# Patient Record
Sex: Male | Born: 1970 | Race: White | Hispanic: No | Marital: Married
Health system: Southern US, Community
[De-identification: ages and names within clinical notes are randomized; demographics above are authoritative.]

## PROBLEM LIST (undated history)

## (undated) DIAGNOSIS — N200 Calculus of kidney: Secondary | ICD-10-CM

---

## 1997-05-12 ENCOUNTER — Emergency Department (HOSPITAL_COMMUNITY): Admission: EM | Admit: 1997-05-12 | Discharge: 1997-05-12 | Payer: Self-pay | Admitting: Emergency Medicine

## 1997-06-04 ENCOUNTER — Emergency Department (HOSPITAL_COMMUNITY): Admission: EM | Admit: 1997-06-04 | Discharge: 1997-06-04 | Payer: Self-pay | Admitting: Emergency Medicine

## 1997-06-30 ENCOUNTER — Emergency Department (HOSPITAL_COMMUNITY): Admission: EM | Admit: 1997-06-30 | Discharge: 1997-06-30 | Payer: Self-pay | Admitting: Emergency Medicine

## 1998-10-16 ENCOUNTER — Emergency Department (HOSPITAL_COMMUNITY): Admission: EM | Admit: 1998-10-16 | Discharge: 1998-10-16 | Payer: Self-pay | Admitting: Emergency Medicine

## 2002-08-14 ENCOUNTER — Emergency Department (HOSPITAL_COMMUNITY): Admission: EM | Admit: 2002-08-14 | Discharge: 2002-08-14 | Payer: Self-pay | Admitting: Emergency Medicine

## 2008-06-27 ENCOUNTER — Emergency Department (HOSPITAL_COMMUNITY): Admission: EM | Admit: 2008-06-27 | Discharge: 2008-06-27 | Payer: Self-pay | Admitting: Emergency Medicine

## 2008-07-02 ENCOUNTER — Ambulatory Visit (HOSPITAL_COMMUNITY): Admission: RE | Admit: 2008-07-02 | Discharge: 2008-07-02 | Payer: Self-pay | Admitting: Urology

## 2010-04-11 LAB — DIFFERENTIAL
Basophils Absolute: 0 10*3/uL (ref 0.0–0.1)
Basophils Relative: 0 % (ref 0–1)
Eosinophils Absolute: 0.1 10*3/uL (ref 0.0–0.7)
Eosinophils Relative: 1 % (ref 0–5)
Lymphs Abs: 0.9 10*3/uL (ref 0.7–4.0)
Neutrophils Relative %: 82 % — ABNORMAL HIGH (ref 43–77)

## 2010-04-11 LAB — CBC
Hemoglobin: 13.5 g/dL (ref 13.0–17.0)
RBC: 4.22 MIL/uL (ref 4.22–5.81)
WBC: 8.6 10*3/uL (ref 4.0–10.5)

## 2010-04-11 LAB — COMPREHENSIVE METABOLIC PANEL
ALT: 16 U/L (ref 0–53)
AST: 21 U/L (ref 0–37)
Alkaline Phosphatase: 47 U/L (ref 39–117)
CO2: 27 mEq/L (ref 19–32)
Chloride: 109 mEq/L (ref 96–112)
GFR calc Af Amer: >60 mL/min (ref >60)
GFR calc non Af Amer: >60 mL/min (ref >60)
Glucose, Bld: 137 mg/dL — ABNORMAL HIGH (ref 70–99)
Sodium: 140 mEq/L (ref 135–145)
Total Bilirubin: 1 mg/dL (ref 0.3–1.2)

## 2010-04-11 LAB — URINALYSIS, ROUTINE W REFLEX MICROSCOPIC
Bilirubin Urine: NEGATIVE
Ketones, ur: 15 mg/dL — AB
Nitrite: NEGATIVE
Protein, ur: NEGATIVE mg/dL
pH: 7.5 (ref 5.0–8.0)

## 2010-04-11 LAB — LIPASE, BLOOD: Lipase: 18 U/L (ref 11–59)

## 2010-04-11 LAB — URINE MICROSCOPIC-ADD ON

## 2013-01-26 ENCOUNTER — Encounter (HOSPITAL_COMMUNITY): Payer: Self-pay | Admitting: Emergency Medicine

## 2013-01-26 ENCOUNTER — Emergency Department (INDEPENDENT_AMBULATORY_CARE_PROVIDER_SITE_OTHER)
Admission: EM | Admit: 2013-01-26 | Discharge: 2013-01-26 | Disposition: A | Discharge status: Home / Self Care | Payer: 59 | Source: Home / Self Care | Attending: Family Medicine | Admitting: Family Medicine

## 2013-01-26 ENCOUNTER — Emergency Department (INDEPENDENT_AMBULATORY_CARE_PROVIDER_SITE_OTHER): Payer: 59

## 2013-01-26 DIAGNOSIS — M47812 Spondylosis without myelopathy or radiculopathy, cervical region: Secondary | ICD-10-CM | POA: Diagnosis not present

## 2013-01-26 DIAGNOSIS — M542 Cervicalgia: Secondary | ICD-10-CM

## 2013-01-26 MED ORDER — PREDNISONE 10 MG PO TABS: ORAL_TABLET | ORAL | Status: AC

## 2013-01-26 MED ORDER — NAPROXEN 500 MG PO TABS: 500.0000 mg | ORAL_TABLET | Freq: Two times a day (BID) | ORAL | Status: AC

## 2013-01-26 NOTE — Discharge Instructions (Signed)
Cervical Strain and Sprain (Whiplash)  with Rehab  Cervical strain and sprains are injuries that commonly occur with "whiplash" injuries. Whiplash occurs when the neck is forcefully whipped backward or forward, such as during a motor vehicle accident. The muscles, ligaments, tendons, discs and nerves of the neck are susceptible to injury when this occurs.  SYMPTOMS   · Pain or stiffness in the front and/or back of neck  · Symptoms may present immediately or up to 24 hours after injury.  · Dizziness, headache, nausea and vomiting.  · Muscle spasm with soreness and stiffness in the neck.  · Tenderness and swelling at the injury site.  CAUSES   Whiplash injuries often occur during contact sports or motor vehicle accidents.   RISK INCREASES WITH:  · Osteoarthritis of the spine.  · Situations that make head or neck accidents or trauma more likely.  · High-risk sports (football, rugby, wrestling, hockey, auto racing, gymnastics, diving, contact karate or boxing).  · Poor strength and flexibility of the neck.  · Previous neck injury.  · Poor tackling technique.  · Improperly fitted or padded equipment.  PREVENTION  · Learn and use proper technique (avoid tackling with the head, spearing and head-butting; use proper falling techniques to avoid landing on the head).  · Warm up and stretch properly before activity.  · Maintain physical fitness:  · Strength, flexibility and endurance.  · Cardiovascular fitness.  · Wear properly fitted and padded protective equipment, such as padded soft collars, for participation in contact sports.  PROGNOSIS   Recovery for cervical strain and sprain injuries is dependent on the extent of the injury. These injuries are usually curable in 1 week to 3 months with appropriate treatment.   RELATED COMPLICATIONS   · Temporary numbness and weakness may occur if the nerve roots are damaged, and this may persist until the nerve has completely healed.  · Chronic pain due to frequent recurrence of  symptoms.  · Prolonged healing, especially if activity is resumed too soon (before complete recovery).  TREATMENT   Treatment initially involves the use of ice and medication to help reduce pain and inflammation. It is also important to perform strengthening and stretching exercises and modify activities that worsen symptoms so the injury does not get worse. These exercises may be performed at home or with a therapist. For patients who experience severe symptoms, a soft padded collar may be recommended to be worn around the neck.   Improving your posture may help reduce symptoms. Posture improvement includes pulling your chin and abdomen in while sitting or standing. If you are sitting, sit in a firm chair with your buttocks against the back of the chair. While sleeping, try replacing your pillow with a small towel rolled to 2 inches in diameter, or use a cervical pillow or soft cervical collar. Poor sleeping positions delay healing.   For patients with nerve root damage, which causes numbness or weakness, the use of a cervical traction apparatus may be recommended. Surgery is rarely necessary for these injuries. However, cervical strain and sprains that are present at birth (congenital) may require surgery.  MEDICATION   · If pain medication is necessary, nonsteroidal anti-inflammatory medications, such as aspirin and ibuprofen, or other minor pain relievers, such as acetaminophen, are often recommended.  · Do not take pain medication for 7 days before surgery.  · Prescription pain relievers may be given if deemed necessary by your caregiver. Use only as directed and only as much as you   need.  HEAT AND COLD:   · Cold treatment (icing) relieves pain and reduces inflammation. Cold treatment should be applied for 10 to 15 minutes every 2 to 3 hours for inflammation and pain and immediately after any activity that aggravates your symptoms. Use ice packs or an ice massage.  · Heat treatment may be used prior to  performing the stretching and strengthening activities prescribed by your caregiver, physical therapist, or athletic trainer. Use a heat pack or a warm soak.  SEEK MEDICAL CARE IF:   · Symptoms get worse or do not improve in 2 weeks despite treatment.  · New, unexplained symptoms develop (drugs used in treatment may produce side effects).  EXERCISES  RANGE OF MOTION (ROM) AND STRETCHING EXERCISES - Cervical Strain and Sprain  These exercises may help you when beginning to rehabilitate your injury. In order to successfully resolve your symptoms, you must improve your posture. These exercises are designed to help reduce the forward-head and rounded-shoulder posture which contributes to this condition. Your symptoms may resolve with or without further involvement from your physician, physical therapist or athletic trainer. While completing these exercises, remember:   · Restoring tissue flexibility helps normal motion to return to the joints. This allows healthier, less painful movement and activity.  · An effective stretch should be held for at least 20 seconds, although you may need to begin with shorter hold times for comfort.  · A stretch should never be painful. You should only feel a gentle lengthening or release in the stretched tissue.  STRETCH- Axial Extensors  · Lie on your back on the floor. You may bend your knees for comfort. Place a rolled up hand towel or dish towel, about 2 inches in diameter, under the part of your head that makes contact with the floor.  · Gently tuck your chin, as if trying to make a "double chin," until you feel a gentle stretch at the base of your head.  · Hold __________ seconds.  Repeat __________ times. Complete this exercise __________ times per day.   STRETECH - Axial Extension   · Stand or sit on a firm surface. Assume a good posture: chest up, shoulders drawn back, abdominal muscles slightly tense, knees unlocked (if standing) and feet hip width apart.  · Slowly retract your  chin so your head slides back and your chin slightly lowers.Continue to look straight ahead.  · You should feel a gentle stretch in the back of your head. Be certain not to feel an aggressive stretch since this can cause headaches later.  · Hold for __________ seconds.  Repeat __________ times. Complete this exercise __________ times per day.  STRETCH  Cervical Side Bend   · Stand or sit on a firm surface. Assume a good posture: chest up, shoulders drawn back, abdominal muscles slightly tense, knees unlocked (if standing) and feet hip width apart.  · Without letting your nose or shoulders move, slowly tip your right / left ear to your shoulder until your feel a gentle stretch in the muscles on the opposite side of your neck.  · Hold __________ seconds.  Repeat __________ times. Complete this exercise __________ times per day.  STRETCH  Cervical Rotators   · Stand or sit on a firm surface. Assume a good posture: chest up, shoulders drawn back, abdominal muscles slightly tense, knees unlocked (if standing) and feet hip width apart.  · Keeping your eyes level with the ground, slowly turn your head until you feel a gentle   stretch along the back and opposite side of your neck.  · Hold __________ seconds.  Repeat __________ times. Complete this exercise __________ times per day.  RANGE OF MOTION - Neck Circles   · Stand or sit on a firm surface. Assume a good posture: chest up, shoulders drawn back, abdominal muscles slightly tense, knees unlocked (if standing) and feet hip width apart.  · Gently roll your head down and around from the back of one shoulder to the back of the other. The motion should never be forced or painful.  · Repeat the motion 10-20 times, or until you feel the neck muscles relax and loosen.  Repeat __________ times. Complete the exercise __________ times per day.  STRENGTHENING EXERCISES - Cervical Strain and Sprain  These exercises may help you when beginning to rehabilitate your injury. They may  resolve your symptoms with or without further involvement from your physician, physical therapist or athletic trainer. While completing these exercises, remember:   · Muscles can gain both the endurance and the strength needed for everyday activities through controlled exercises.  · Complete these exercises as instructed by your physician, physical therapist or athletic trainer. Progress the resistance and repetitions only as guided.  · You may experience muscle soreness or fatigue, but the pain or discomfort you are trying to eliminate should never worsen during these exercises. If this pain does worsen, stop and make certain you are following the directions exactly. If the pain is still present after adjustments, discontinue the exercise until you can discuss the trouble with your clinician.  STRENGTH Cervical Flexors, Isometric  · Face a wall, standing about 6 inches away. Place a small pillow, a ball about 6-8 inches in diameter, or a folded towel between your forehead and the wall.  · Slightly tuck your chin and gently push your forehead into the soft object. Push only with mild to moderate intensity, building up tension gradually. Keep your jaw and forehead relaxed.  · Hold 10 to 20 seconds. Keep your breathing relaxed.  · Release the tension slowly. Relax your neck muscles completely before you start the next repetition.  Repeat __________ times. Complete this exercise __________ times per day.  STRENGTH- Cervical Lateral Flexors, Isometric   · Stand about 6 inches away from a wall. Place a small pillow, a ball about 6-8 inches in diameter, or a folded towel between the side of your head and the wall.  · Slightly tuck your chin and gently tilt your head into the soft object. Push only with mild to moderate intensity, building up tension gradually. Keep your jaw and forehead relaxed.  · Hold 10 to 20 seconds. Keep your breathing relaxed.  · Release the tension slowly. Relax your neck muscles completely before  you start the next repetition.  Repeat __________ times. Complete this exercise __________ times per day.  STRENGTH  Cervical Extensors, Isometric   · Stand about 6 inches away from a wall. Place a small pillow, a ball about 6-8 inches in diameter, or a folded towel between the back of your head and the wall.  · Slightly tuck your chin and gently tilt your head back into the soft object. Push only with mild to moderate intensity, building up tension gradually. Keep your jaw and forehead relaxed.  · Hold 10 to 20 seconds. Keep your breathing relaxed.  · Release the tension slowly. Relax your neck muscles completely before you start the next repetition.  Repeat __________ times. Complete this exercise __________ times per   day.  POSTURE AND BODY MECHANICS CONSIDERATIONS - Cervical Strain and Sprain  Keeping correct posture when sitting, standing or completing your activities will reduce the stress put on different body tissues, allowing injured tissues a chance to heal and limiting painful experiences. The following are general guidelines for improved posture. Your physician or physical therapist will provide you with any instructions specific to your needs. While reading these guidelines, remember:  · The exercises prescribed by your provider will help you have the flexibility and strength to maintain correct postures.  · The correct posture provides the optimal environment for your joints to work. All of your joints have less wear and tear when properly supported by a spine with good posture. This means you will experience a healthier, less painful body.  · Correct posture must be practiced with all of your activities, especially prolonged sitting and standing. Correct posture is as important when doing repetitive low-stress activities (typing) as it is when doing a single heavy-load activity (lifting).  PROLONGED STANDING WHILE SLIGHTLY LEANING FORWARD  When completing a task that requires you to lean forward while  standing in one place for a long time, place either foot up on a stationary 2-4 inch high object to help maintain the best posture. When both feet are on the ground, the low back tends to lose its slight inward curve. If this curve flattens (or becomes too large), then the back and your other joints will experience too much stress, fatigue more quickly and can cause pain.   RESTING POSITIONS  Consider which positions are most painful for you when choosing a resting position. If you have pain with flexion-based activities (sitting, bending, stooping, squatting), choose a position that allows you to rest in a less flexed posture. You would want to avoid curling into a fetal position on your side. If your pain worsens with extension-based activities (prolonged standing, working overhead), avoid resting in an extended position such as sleeping on your stomach. Most people will find more comfort when they rest with their spine in a more neutral position, neither too rounded nor too arched. Lying on a non-sagging bed on your side with a pillow between your knees, or on your back with a pillow under your knees will often provide some relief. Keep in mind, being in any one position for a prolonged period of time, no matter how correct your posture, can still lead to stiffness.  WALKING  Walk with an upright posture. Your ears, shoulders and hips should all line-up.  OFFICE WORK  When working at a desk, create an environment that supports good, upright posture. Without extra support, muscles fatigue and lead to excessive strain on joints and other tissues.  CHAIR:  · A chair should be able to slide under your desk when your back makes contact with the back of the chair. This allows you to work closely.  · The chair's height should allow your eyes to be level with the upper part of your monitor and your hands to be slightly lower than your elbows.  · Body position:  · Your feet should make contact with the floor. If this is  not possible, use a foot rest.  · Keep your ears over your shoulders. This will reduce stress on your neck and low back.  Document Released: 12/19/2004 Document Revised: 04/15/2012 Document Reviewed: 04/02/2008  [employer]® Patient Information ©2014 [employer].

## 2013-01-26 NOTE — ED Notes (Signed)
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

## 2013-01-26 NOTE — ED Provider Notes (Signed)
CSN: [id number]     Arrival date & time 01/26/13  1541 History   First MD Initiated Contact with Patient 01/26/13 1641     Chief Complaint  Patient presents with  . Neck Pain   (Consider location/radiation/quality/duration/timing/severity/associated sxs/prior Treatment) HPI Comments: 43 year old male presents complaining of neck pain and stiffness, pain on his right shoulder, and some numbness in his right hand. This is been going on for a couple of days now. The neck was extremely stiff yesterday and the day before to the point where he could not even move his head. Today is slightly better than yesterday but the neck is still very painful and stiff. He denies any injury to this. It started on Friday when he got home from work. He did not do anything at all at work. He has a recent history of he assumes are in the last week but this was completely better before the neck stiffness started. He denies any other associated symptoms and has no history of neck problems.  Patient is a 43 y.o. male presenting with neck pain.  Neck Pain Associated symptoms: numbness   Associated symptoms: no chest pain, no fever and no weakness     History reviewed. No pertinent past medical history. No past surgical history on file. No family history on file. History  Substance Use Topics  . Smoking status: Not on file  . Smokeless tobacco: Not on file  . Alcohol Use: Not on file    Review of Systems  Constitutional: Negative for fever, chills and fatigue.  HENT: Negative for sore throat.   Eyes: Negative for visual disturbance.  Respiratory: Negative for cough and shortness of breath.   Cardiovascular: Negative for chest pain, palpitations and leg swelling.  Gastrointestinal: Negative for nausea, vomiting, abdominal pain, diarrhea and constipation.  Genitourinary: Negative for dysuria, urgency, frequency and hematuria.  Musculoskeletal: Positive for arthralgias, neck pain and neck stiffness. Negative for  myalgias.  Skin: Negative for rash.  Neurological: Positive for numbness. Negative for dizziness, weakness and light-headedness.    Allergies  Review of patient's allergies indicates no known allergies.  Home Medications   Current Outpatient Rx  Name  Route  Sig  Dispense  Refill  . naproxen (NAPROSYN) 500 MG tablet   Oral   Take 1 tablet (500 mg total) by mouth 2 (two) times daily.   60 tablet   0   . predniSONE (DELTASONE) 10 MG tablet      4 tabs PO QD for 4 days;  3 tabs PO QD for 3 days;  2 tabs PO QD for 2 days;  1 tab PO QD for 1 day   30 tablet   0    BP 117/70  Pulse 65  Temp(Src) 98.3 F (36.8 C) (Oral)  Resp 14  SpO2 100% Physical Exam  Nursing note and vitals reviewed. Constitutional: He is oriented to person, place, and time. He appears well-developed and well-nourished. No distress.  HENT:  Head: Normocephalic.  Cardiovascular: Normal rate, regular rhythm and normal heart sounds.  Exam reveals no gallop and no friction rub.   No murmur heard. Pulmonary/Chest: Effort normal and breath sounds normal. No respiratory distress. He has no wheezes. He has no rales.  Musculoskeletal:       Cervical back: He exhibits decreased range of motion (stiff, with only about 20 of flexion actively and passively ). He exhibits no tenderness, no bony tenderness and no deformity.  Neurological: He is alert and oriented to person,  place, and time. Coordination normal.  Skin: Skin is warm and dry. No rash noted. He is not diaphoretic.  Psychiatric: He has a normal mood and affect. Judgment normal.    ED Course  Procedures (including critical care time) Labs Review Labs Reviewed - No data to display Imaging Review Dg Cervical Spine Complete  01/26/2013   CLINICAL DATA:  Neck pain primarily on the left.  EXAM: CERVICAL SPINE  4+ VIEWS  COMPARISON:  None.  FINDINGS: There is no fracture or subluxation or prevertebral soft tissue swelling. There is minimal narrowing of the  C5-6 disc space. There is no facet arthritis. There are minimal uncinate spurs extending into the left neural foramen at C3-4.  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   Electronically Signed   By: Geanie CooleyJim  Maxwell M.D.   On: 01/26/2013 17:31    EKG Interpretation    Date/Time:    Ventricular Rate:    PR Interval:    QRS Duration:   QT Interval:    QTC Calculation:   R Axis:     Text Interpretation:              MDM   1. Neck pain   2. Neck arthritis    Treating for arthritis type pain. Referring to orthopedics for followup. ED if any worsening of neck stiffness.   Meds ordered this encounter  Medications  . naproxen (NAPROSYN) 500 MG tablet    Sig: Take 1 tablet (500 mg total) by mouth 2 (two) times daily.    Dispense:  60 tablet    Refill:  0    Order Specific Question:  Supervising Provider    Answer:  Linna HoffKINDL, JAMES D [phone][[id number]]  . predniSONE (DELTASONE) 10 MG tablet    Sig: 4 tabs PO QD for 4 days;  3 tabs PO QD for 3 days;  2 tabs PO QD for 2 days;  1 tab PO QD for 1 day    Dispense:  30 tablet    Refill:  0    Order Specific Question:  Supervising Provider    Answer:  Bradd CanaryKINDL, JAMES D [[id number]]       Graylon GoodZachary H Myles Mallicoat, PA-C 01/26/13 1751

## 2013-01-27 NOTE — ED Provider Notes (Signed)
Medical screening examination/treatment/procedure(s) were performed by resident physician or non-physician practitioner and as supervising physician I was immediately available for consultation/collaboration.   Mariselda Badalamenti DOUGLAS MD.   Maclean Foister D Darnice Comrie, MD 01/27/13 1435 

## 2015-12-07 ENCOUNTER — Ambulatory Visit
Admission: RE | Admit: 2015-12-07 | Discharge: 2015-12-07 | Disposition: A | Discharge status: Home / Self Care | Payer: BLUE CROSS/BLUE SHIELD | Source: Ambulatory Visit | Attending: Family Medicine | Admitting: Family Medicine

## 2015-12-07 ENCOUNTER — Other Ambulatory Visit: Payer: Self-pay | Admitting: Family Medicine

## 2015-12-07 DIAGNOSIS — F17200 Nicotine dependence, unspecified, uncomplicated: Secondary | ICD-10-CM

## 2015-12-07 DIAGNOSIS — Z77090 Contact with and (suspected) exposure to asbestos: Secondary | ICD-10-CM

## 2018-01-14 ENCOUNTER — Ambulatory Visit
Admission: RE | Admit: 2018-01-14 | Discharge: 2018-01-14 | Disposition: A | Discharge status: Home / Self Care | Payer: BLUE CROSS/BLUE SHIELD | Source: Ambulatory Visit | Attending: Family Medicine | Admitting: Family Medicine

## 2018-01-14 ENCOUNTER — Other Ambulatory Visit: Payer: Self-pay | Admitting: Family Medicine

## 2018-01-14 DIAGNOSIS — R52 Pain, unspecified: Secondary | ICD-10-CM

## 2018-01-14 DIAGNOSIS — Z77090 Contact with and (suspected) exposure to asbestos: Secondary | ICD-10-CM

## 2018-05-30 ENCOUNTER — Encounter (HOSPITAL_COMMUNITY): Payer: Self-pay | Admitting: Emergency Medicine

## 2018-05-30 ENCOUNTER — Emergency Department (HOSPITAL_COMMUNITY)
Admission: EM | Admit: 2018-05-30 | Discharge: 2018-05-30 | Disposition: A | Discharge status: Home / Self Care | Payer: BC Managed Care – PPO | Attending: Emergency Medicine | Admitting: Emergency Medicine

## 2018-05-30 ENCOUNTER — Other Ambulatory Visit: Payer: Self-pay

## 2018-05-30 ENCOUNTER — Emergency Department (HOSPITAL_COMMUNITY): Payer: BC Managed Care – PPO

## 2018-05-30 DIAGNOSIS — N23 Unspecified renal colic: Secondary | ICD-10-CM | POA: Diagnosis not present

## 2018-05-30 DIAGNOSIS — Z79899 Other long term (current) drug therapy: Secondary | ICD-10-CM | POA: Insufficient documentation

## 2018-05-30 DIAGNOSIS — R109 Unspecified abdominal pain: Secondary | ICD-10-CM | POA: Diagnosis present

## 2018-05-30 HISTORY — DX: Calculus of kidney: N20.0

## 2018-05-30 LAB — URINALYSIS, ROUTINE W REFLEX MICROSCOPIC
Bilirubin Urine: NEGATIVE
Glucose, UA: 150 mg/dL — AB
Hgb urine dipstick: NEGATIVE
Ketones, ur: 5 mg/dL — AB
Leukocytes,Ua: NEGATIVE
Nitrite: NEGATIVE
Protein, ur: NEGATIVE mg/dL
Specific Gravity, Urine: 1.026 (ref 1.005–1.030)
pH: 5 (ref 5.0–8.0)

## 2018-05-30 LAB — BASIC METABOLIC PANEL
Anion gap: 9 (ref 5–15)
BUN: 14 mg/dL (ref 6–20)
CO2: 24 mmol/L (ref 22–32)
Calcium: 9 mg/dL (ref 8.9–10.3)
Chloride: 101 mmol/L (ref 98–111)
Creatinine, Ser: 0.86 mg/dL (ref 0.61–1.24)
GFR calc Af Amer: >60 mL/min (ref >60)
GFR calc non Af Amer: >60 mL/min (ref >60)
Glucose, Bld: 93 mg/dL (ref 70–99)
Potassium: 3.6 mmol/L (ref 3.5–5.1)
Sodium: 134 mmol/L — ABNORMAL LOW (ref 135–145)

## 2018-05-30 LAB — CBC WITH DIFFERENTIAL/PLATELET
Abs Immature Granulocytes: 0.02 10*3/uL (ref 0.00–0.07)
Basophils Absolute: 0.1 10*3/uL (ref 0.0–0.1)
Basophils Relative: 1 %
Eosinophils Absolute: 0.1 10*3/uL (ref 0.0–0.5)
Eosinophils Relative: 1 %
HCT: 38.9 % — ABNORMAL LOW (ref 39.0–52.0)
Hemoglobin: 13.5 g/dL (ref 13.0–17.0)
Immature Granulocytes: 0 %
Lymphocytes Relative: 19 %
Lymphs Abs: 1.2 10*3/uL (ref 0.7–4.0)
MCH: 32.5 pg (ref 26.0–34.0)
MCHC: 34.7 g/dL (ref 30.0–36.0)
MCV: 93.7 fL (ref 80.0–100.0)
Monocytes Absolute: 0.6 10*3/uL (ref 0.1–1.0)
Monocytes Relative: 9 %
Neutro Abs: 4.4 10*3/uL (ref 1.7–7.7)
Neutrophils Relative %: 70 %
Platelets: 180 10*3/uL (ref 150–400)
RBC: 4.15 MIL/uL — ABNORMAL LOW (ref 4.22–5.81)
RDW: 11.3 % — ABNORMAL LOW (ref 11.5–15.5)
WBC: 6.3 10*3/uL (ref 4.0–10.5)
nRBC: 0 % (ref 0.0–0.2)

## 2018-05-30 MED ORDER — KETOROLAC TROMETHAMINE 30 MG/ML IJ SOLN
15.0000 mg | Freq: Once | INTRAMUSCULAR | Status: AC
  Administered 2018-05-30: 15 mg via INTRAVENOUS
  Filled 2018-05-30: qty 1

## 2018-05-30 MED ORDER — OXYCODONE-ACETAMINOPHEN 5-325 MG PO TABS: 1.0000 | ORAL_TABLET | Freq: Four times a day (QID) | ORAL | 0 refills | Status: AC | PRN

## 2018-05-30 MED ORDER — TAMSULOSIN HCL 0.4 MG PO CAPS: 0.4000 mg | ORAL_CAPSULE | Freq: Every day | ORAL | 0 refills | Status: AC

## 2018-05-30 MED ORDER — MORPHINE SULFATE (PF) 4 MG/ML IV SOLN
4.0000 mg | Freq: Once | INTRAVENOUS | Status: AC
  Administered 2018-05-30: 4 mg via INTRAVENOUS
  Filled 2018-05-30: qty 1

## 2018-05-30 MED ORDER — ONDANSETRON 4 MG PO TBDP: 4.0000 mg | ORAL_TABLET | Freq: Three times a day (TID) | ORAL | 0 refills | Status: AC | PRN

## 2018-05-30 MED ORDER — SODIUM CHLORIDE 0.9 % IV BOLUS
1000.0000 mL | Freq: Once | INTRAVENOUS | Status: AC
  Administered 2018-05-30: 1000 mL via INTRAVENOUS

## 2018-05-30 MED ORDER — ONDANSETRON HCL 4 MG/2ML IJ SOLN
4.0000 mg | Freq: Once | INTRAMUSCULAR | Status: AC
  Administered 2018-05-30: 4 mg via INTRAVENOUS
  Filled 2018-05-30: qty 2

## 2018-05-30 NOTE — ED Notes (Signed)
Patient given discharge teaching and verbalized understanding. Patient ambulated out of ED with a steady gait. 

## 2018-05-30 NOTE — ED Notes (Signed)
Postvoid residual was 0 mL.

## 2018-05-30 NOTE — ED Triage Notes (Signed)
Patient arrived by self from home. Pt c/o intermittent LFT sided pain originating from flank area. Pt stated he's had difficult urination.   Pt rates pain 10/10.  Hx of kidney stones.

## 2018-05-30 NOTE — ED Provider Notes (Signed)
[facility]-EMERGENCY DEPT Provider Note   CSN: [id number] Arrival date & time: 05/30/18  [id number]    History   Chief Complaint Chief Complaint  Patient presents with  . Flank Pain    HPI Alejandro Harris is a 48 y.o. male.     HPI   Alejandro Harris is a 48 y.o. male, with a history of kidney stones, presenting to the ED with left flank pain beginning this morning.  Pain is sharp, waxing and waning, severe, radiating into the left lower quadrant, left lower back, and groin. Also notes he has had some difficulty urinating as well as nausea. He states he has had similar pain with kidney stones in the past. Denies fever/chills, vomiting, diarrhea, constipation, hematochezia/melena, hematuria, dysuria, or any other complaints.      Past Medical History:  Diagnosis Date  . Kidney stone     There are no active problems to display for this patient.   History reviewed. No pertinent surgical history.      Home Medications    Prior to Admission medications   Medication Sig Start Date End Date Taking? Authorizing Provider  cetirizine (ZYRTEC) 10 MG tablet Take 10 mg by mouth daily.   Yes [provider]  ibuprofen (ADVIL) 200 MG tablet Take 400 mg by mouth every 6 (six) hours as needed for headache or mild pain.   Yes [provider]  naproxen sodium (ALEVE) 220 MG tablet Take 220 mg by mouth 2 (two) times daily as needed (pain/headache).   Yes [provider]  polyvinyl alcohol (LIQUIFILM TEARS) 1.4 % ophthalmic solution Place 1 drop into both eyes as needed for dry eyes.   Yes [provider]  naproxen (NAPROSYN) 500 MG tablet Take 1 tablet (500 mg total) by mouth 2 (two) times daily. Patient not taking: Reported on 05/30/2018 01/26/13   Autumn MessingBaker, Zachary H, PA-C  ondansetron (ZOFRAN ODT) 4 MG disintegrating tablet Take 1 tablet (4 mg total) by mouth every 8 (eight) hours as needed for nausea or vomiting. 05/30/18   Joy,  Shawn C, PA-C  oxyCODONE-acetaminophen (PERCOCET/ROXICET) 5-325 MG tablet Take 1-2 tablets by mouth every 6 (six) hours as needed for severe pain. 05/30/18   Joy, Shawn C, PA-C  predniSONE (DELTASONE) 10 MG tablet 4 tabs PO QD for 4 days;  3 tabs PO QD for 3 days;  2 tabs PO QD for 2 days;  1 tab PO QD for 1 day Patient not taking: Reported on 05/30/2018 01/26/13   Graylon GoodBaker, Zachary H, PA-C  tamsulosin (FLOMAX) 0.4 MG CAPS capsule Take 1 capsule (0.4 mg total) by mouth daily. 05/30/18   Anselm PancoastJoy, Shawn C, PA-C    Family History History reviewed. No pertinent family history.  Social History Social History   Tobacco Use  . Smoking status: Not on file  Substance Use Topics  . Alcohol use: Not on file  . Drug use: Not on file     Allergies   Patient has no known allergies.   Review of Systems Review of Systems  Constitutional: Negative for chills, diaphoresis and fever.  Respiratory: Negative for shortness of breath.   Cardiovascular: Negative for chest pain.  Gastrointestinal: Positive for abdominal pain and nausea. Negative for blood in stool, constipation, diarrhea and vomiting.  Genitourinary: Positive for flank pain. Negative for discharge, dysuria, hematuria and scrotal swelling.  Musculoskeletal: Positive for back pain.  All other systems reviewed and are negative.    Physical Exam Updated Vital Signs  BP 118/73 (BP Location: Left Arm)   Pulse 73   Temp (!) 97.5 F (36.4 C) (Oral)   Resp 15   Ht 5\' 9"  (1.753 m)   Wt 68 kg   SpO2 100%   BMI 22.15 kg/m   Physical Exam Vitals signs and nursing note reviewed.  Constitutional:      General: He is not in acute distress.    Appearance: He is well-developed. He is not diaphoretic.  HENT:     Head: Normocephalic and atraumatic.     Mouth/Throat:     Mouth: Mucous membranes are moist.     Pharynx: Oropharynx is clear.  Eyes:     Conjunctiva/sclera: Conjunctivae normal.  Neck:     Musculoskeletal: Neck supple.   Cardiovascular:     Rate and Rhythm: Normal rate and regular rhythm.     Pulses: Normal pulses.          Radial pulses are 2+ on the right side and 2+ on the left side.       Posterior tibial pulses are 2+ on the right side and 2+ on the left side.     Heart sounds: Normal heart sounds.     Comments: Tactile temperature in the extremities appropriate and equal bilaterally. Pulmonary:     Effort: Pulmonary effort is normal. No respiratory distress.     Breath sounds: Normal breath sounds.  Abdominal:     Palpations: Abdomen is soft.     Tenderness: There is abdominal tenderness. There is left CVA tenderness. There is no guarding.       Comments: Tenderness is worse in the left flank, but is accompanied by tenderness in the left lower quadrant and left inguinal region.  Genitourinary:    Comments: He notes some "sensitivity" to left testicle upon palpation.  No noted swelling, color change, or abnormal position. Penis without swelling, lesions, or tenderness. No penile discharge.  No inguinal hernia noted. Cremasteric reflex intact. No inguinal lymphadenopathy. Otherwise normal male genitalia. Musculoskeletal:     Right lower leg: No edema.     Left lower leg: No edema.  Lymphadenopathy:     Cervical: No cervical adenopathy.  Skin:    General: Skin is warm and dry.  Neurological:     Mental Status: He is alert.  Psychiatric:        Mood and Affect: Mood and affect normal.        Speech: Speech normal.        Behavior: Behavior normal.      ED Treatments / Results  Labs (all labs ordered are listed, but only abnormal results are displayed) Labs Reviewed  URINALYSIS, ROUTINE W REFLEX MICROSCOPIC - Abnormal; Notable for the following components:      Result Value   APPearance HAZY (*)    Glucose, UA 150 (*)    Ketones, ur 5 (*)    All other components within normal limits  CBC WITH DIFFERENTIAL/PLATELET - Abnormal; Notable for the following components:   RBC 4.15 (*)     HCT 38.9 (*)    RDW 11.3 (*)    All other components within normal limits  BASIC METABOLIC PANEL - Abnormal; Notable for the following components:   Sodium 134 (*)    All other components within normal limits    EKG None  Radiology US Renal  Result Date: 05/30/2018 CLINICAL DATA:  Left flank pain EXAM: RENAL / URINARY TRACT ULTRASOUND COMPLETE COMPARISON:  CT 06/27/2008 FINDINGS: Right Kidney: Renal  measurements: 11.9 x 4.8 x 4.2 cm = volume: 125 mL . Echogenicity within normal limits. No mass or hydronephrosis visualized. Left Kidney: Renal measurements: 12.8 x 5.8 x 5.9 cm = volume: 231 mL. Mild left hydronephrosis and perinephric stranding. Probable nonobstructing stone in the midpole of the left kidney measuring 2 mm. Normal echotexture. Bladder: Appears normal for degree of bladder distention.  Prominent prostate IMPRESSION: Mild left hydronephrosis. Probable nonobstructing left midpole 2 mm renal stone. Electronically Signed   By: Charlett Nose M.D.   On: 05/30/2018 12:45    Procedures Procedures (including critical care time)  Medications Ordered in ED Medications  sodium chloride 0.9 % bolus 1,000 mL (0 mLs Intravenous Stopped 05/30/18 1317)  ketorolac (TORADOL) 30 MG/ML injection 15 mg (15 mg Intravenous Given 05/30/18 1033)  ondansetron (ZOFRAN) injection 4 mg (4 mg Intravenous Given 05/30/18 1033)  morphine 4 MG/ML injection 4 mg (4 mg Intravenous Given 05/30/18 1115)     Initial Impression / Assessment and Plan / ED Course  I have reviewed the triage vital signs and the nursing notes.  Pertinent labs & imaging results that were available during my care of the patient were reviewed by me and considered in my medical decision making (see chart for details).  Clinical Course as of May 30 749  Thu May 30, 2018  1300 Patient states his pain is manageable at 2-3/10.   [SJ]    Clinical Course User Index [SJ] Joy, Shawn C, PA-C       Patient presents with lower back and  flank pain.  He has a history of renal stone several years ago and states this feels similar.  His symptoms are suggestive of renal colic.  Though patient states he has difficulty urinating, he was able to urinate in the ED early in his course.  I suspect his subjective difficulty urinating may be from ureteral spasm. Labs overall reassuring.  Some mild ketonuria and mild hyponatremia for which he was treated with IV fluids.  Mild hydronephrosis noted on renal ultrasound supports diagnosis of ureteral stone and renal colic.  No evidence of infection on UA.  Urology follow-up.  Pain adequately controlled prior to discharge.  The patient was given instructions for home care as well as return precautions. Patient voices understanding of these instructions, accepts the plan, and is comfortable with discharge.  Findings and plan of care discussed with Derwood Kaplan, MD. Dr. Rhunette Croft personally evaluated and examined this patient.  Final Clinical Impressions(s) / ED Diagnoses   Final diagnoses:  Renal colic on left side    ED Discharge Orders         Ordered    oxyCODONE-acetaminophen (PERCOCET/ROXICET) 5-325 MG tablet  Every 6 hours PRN     05/30/18 1310    ondansetron (ZOFRAN ODT) 4 MG disintegrating tablet  Every 8 hours PRN     05/30/18 1310    tamsulosin (FLOMAX) 0.4 MG CAPS capsule  Daily     05/30/18 1310           Joy, Hillard Danker, PA-C 05/31/18 0754    Derwood Kaplan, MD 05/31/18 [phone]

## 2018-05-30 NOTE — Discharge Instructions (Signed)
°  Kidney Stone There is evidence of a kidney stone on the left side.  It appears as though it is on its way out.  Some kidney stones can take up to 30 days to pass. Hydration: Hydration is key to helping a kidney stone pass.  Have a goal of half a liter of water every hour or two. Antiinflammatory medications: Take 600 mg of ibuprofen every 6 hours or 440 mg (over the counter dose) to 500 mg (prescription dose) of naproxen every 12 hours for the next 3 days. After this time, these medications may be used as needed for pain. Take these medications with food to avoid upset stomach. Choose only one of these medications, do not take them together. Acetaminophen: Should you continue to have additional pain while taking the ibuprofen or naproxen, you may add in acetaminophen (generic for Tylenol) as needed. Your daily total maximum amount of acetaminophen from all sources should be limited to 4000mg /day for persons without liver problems, or 2000mg /day for those with liver problems. Percocet: May take Percocet (oxycodone-acetaminophen) as needed for severe pain.   Do not drive or perform other dangerous activities while taking this medication as it can cause drowsiness as well as changes in reaction time and judgement.  Please note that each pill of Percocet contains 325 mg of acetaminophen (generic for Tylenol) and the above dosage limits apply. Tamsulosin: This medication is designed to help the stone pass.  Take this medication daily until stone passes. Nausea/vomiting: Use the ondansetron (generic for Zofran) for nausea or vomiting.  This medication may not prevent all vomiting or nausea, but can help facilitate better hydration. Things that can help with nausea/vomiting also include peppermint/menthol candies, vitamin B12, and ginger. Follow-up: Follow-up with the urologist as soon as possible on this matter. Return: Return to the ED for significantly increased pain, difficulty urinating, pain with  urination, fever, uncontrolled vomiting, or any other major concerns.  For prescription assistance, may try using prescription discount sites or apps, such as goodrx.com

## 2019-03-10 ENCOUNTER — Ambulatory Visit: Payer: BC Managed Care – PPO | Attending: Internal Medicine

## 2019-03-10 DIAGNOSIS — Z23 Encounter for immunization: Secondary | ICD-10-CM | POA: Insufficient documentation

## 2019-03-10 NOTE — Progress Notes (Signed)
   Covid-19 Vaccination Clinic  Name:  Alejandro Harris    MRN: [id number] DOB: 11/17/1970  03/10/2019  Alejandro Harris was observed post Covid-19 immunization for 15 minutes without incident. He was provided with Vaccine Information Sheet and instruction to access the V-Safe system.   Alejandro Harris was instructed to call 911 with any severe reactions post vaccine: Marland Kitchen Difficulty breathing  . Swelling of face and throat  . A fast heartbeat  . A bad rash all over body  . Dizziness and weakness   Immunizations Administered    Name Date Dose VIS Date Route   Pfizer COVID-19 Vaccine 03/10/2019  4:58 PM 0.3 mL 12/13/2018 Intramuscular   Manufacturer: [employer], [employer]   Lot: [id number]   NDC: [address]-8

## 2019-03-31 ENCOUNTER — Ambulatory Visit: Payer: BC Managed Care – PPO | Attending: Internal Medicine

## 2019-03-31 DIAGNOSIS — Z23 Encounter for immunization: Secondary | ICD-10-CM

## 2019-03-31 NOTE — Progress Notes (Signed)
   Covid-19 Vaccination Clinic  Name:  Alejandro Harris    MRN: [id number] DOB: Jun 24, 1970  03/31/2019  [name] was observed post Covid-19 immunization for 15 minutes without incident. He was provided with Vaccine Information Sheet and instruction to access the V-Safe system.   [name] was instructed to call 911 with any severe reactions post vaccine: Marland Kitchen Difficulty breathing  . Swelling of face and throat  . A fast heartbeat  . A bad rash all over body  . Dizziness and weakness   Immunizations Administered    Name Date Dose VIS Date Route   Pfizer COVID-19 Vaccine 03/31/2019  8:32 AM 0.3 mL 12/13/2018 Intramuscular   Manufacturer: [employer], [employer]   Lot: [id number]   NDC: [address]-8

## 2019-06-30 IMAGING — CR DG CERVICAL SPINE COMPLETE 4+V
5 series · 5 of 5 positions shown · non-contrast
Comparison: January 26, 2013

CLINICAL DATA: Cervicalgia with right-sided radicular symptoms

EXAM:
CERVICAL SPINE - COMPLETE 4+ VIEW

[w cervical spine lat]
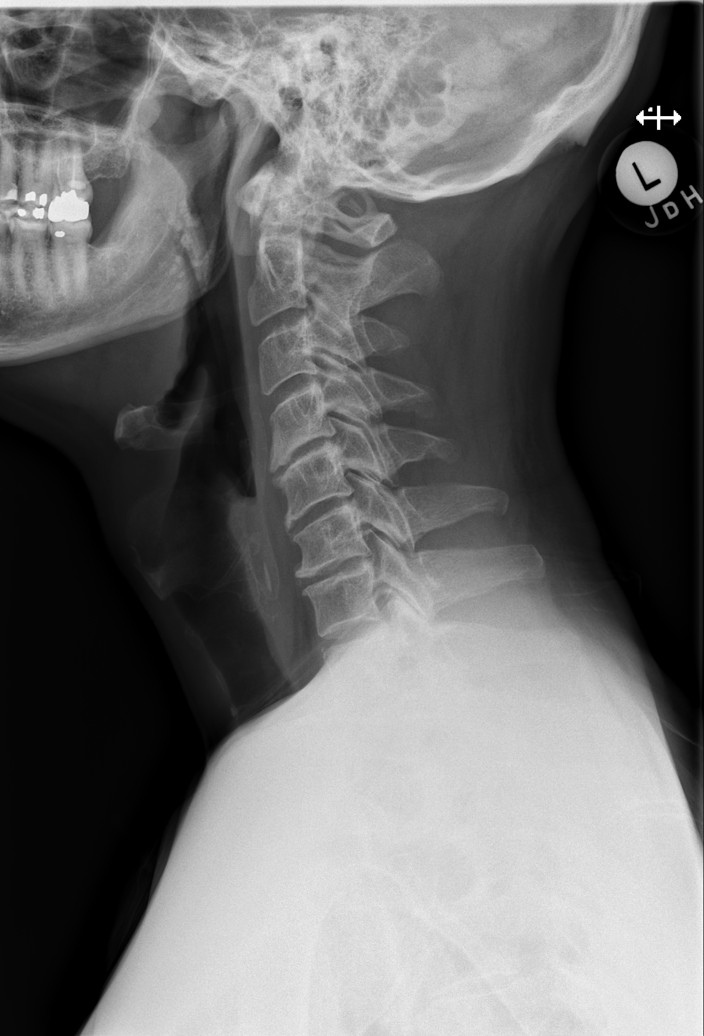

[w cervical spine ap_obl (1 of 2)]
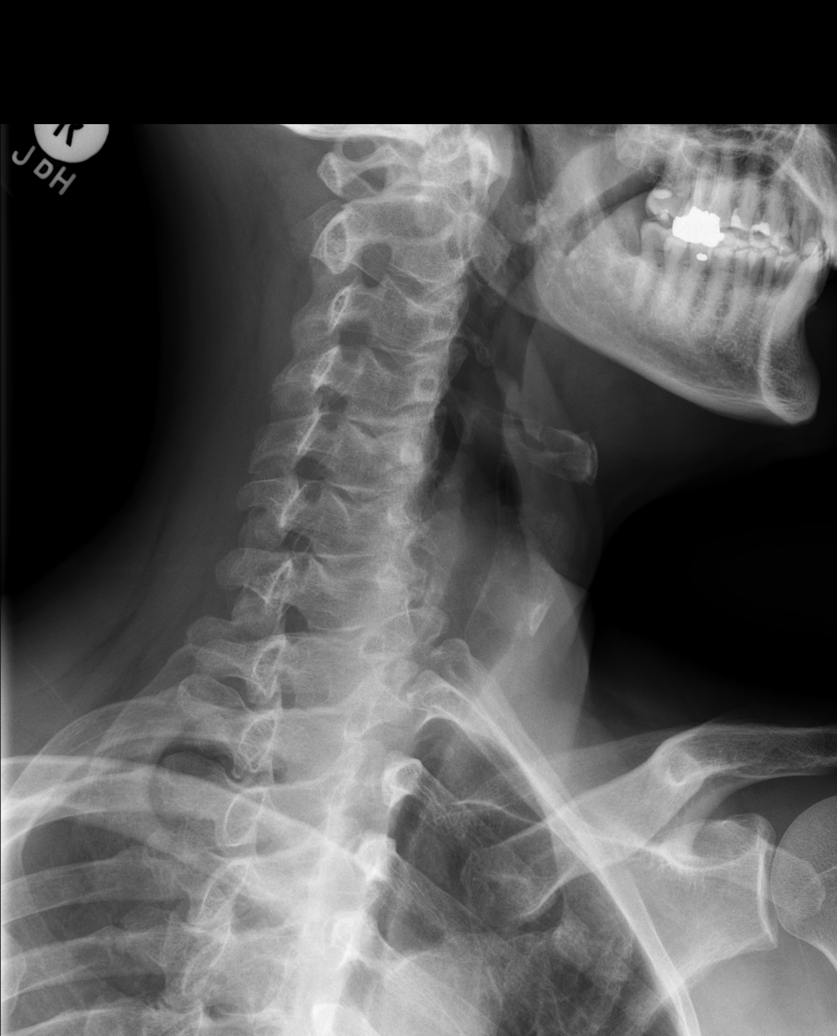

[w cervical spine ap_obl (2 of 2)]
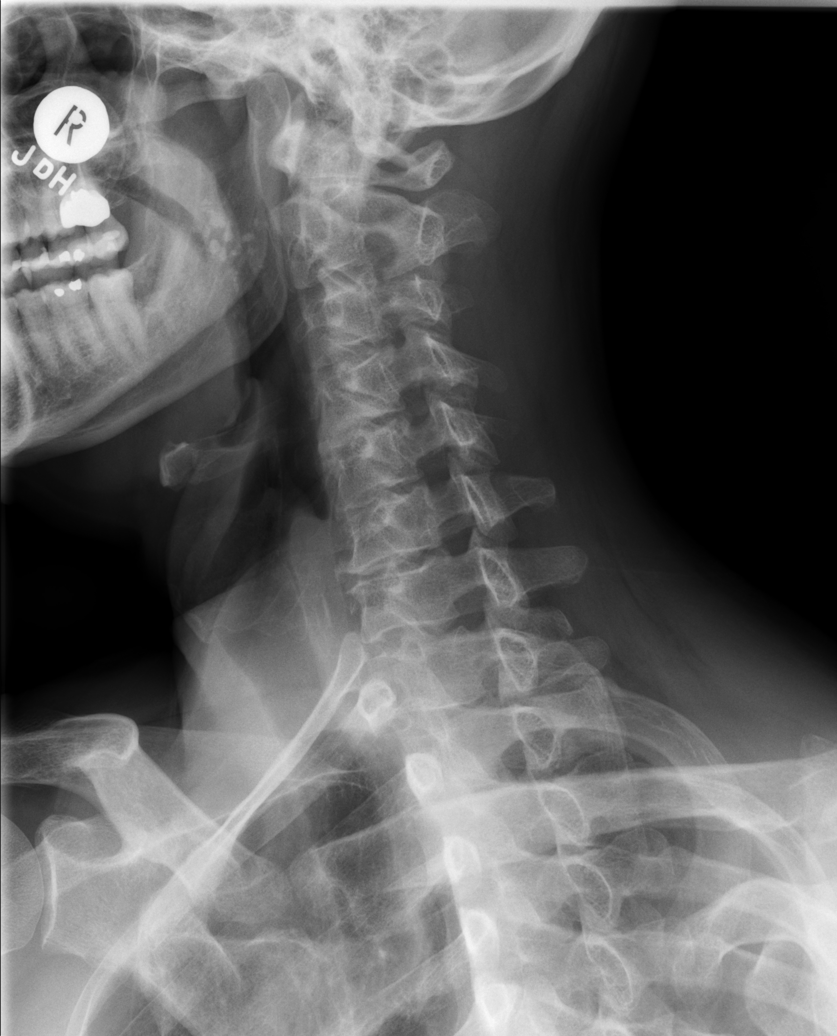

[w cervical spine ap]
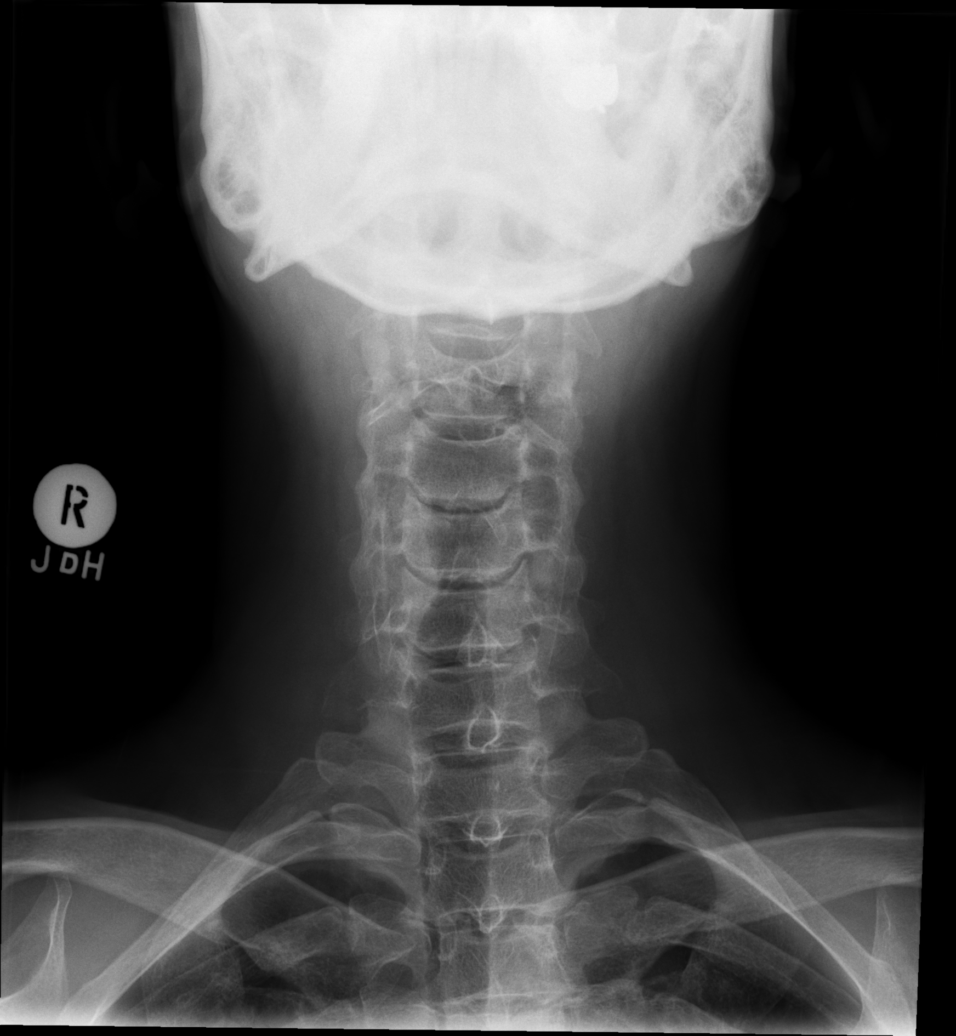

[w cervical spine odontoid]
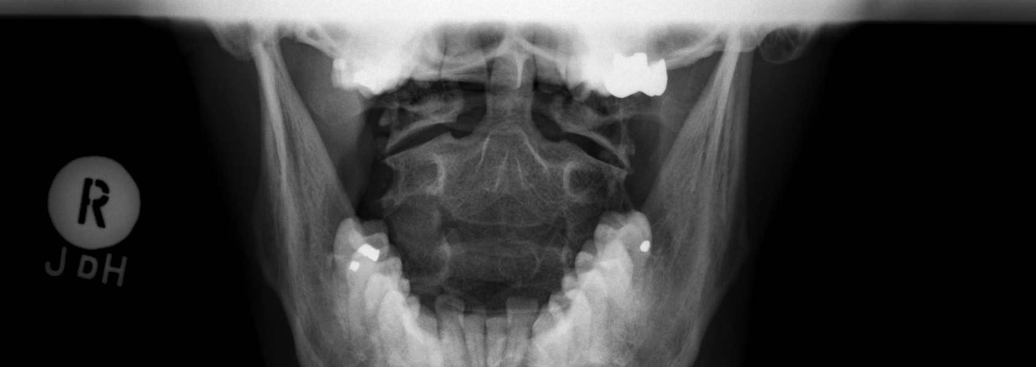

[5 of 5 positions shown; findings below may reference images not displayed]

FINDINGS: Frontal, lateral, open-mouth odontoid, and bilateral oblique views
were obtained. There is no demonstrable fracture or
spondylolisthesis. Prevertebral soft tissues and predental space
regions are normal. There is disc space narrowing, moderately
severe, at C4-5 and C6-7 with slightly milder disc space narrowing
at C5-6 and C7-T1. There has been progression of disc space
narrowing at several levels since the 5326 study. There is facet
hypertrophy with exit foraminal narrowing at C4-5 bilaterally and at
C3-4 on the left. Lung apices are clear.
IMPRESSION: Osteoarthritic change at multiple levels with progression since
prior study from 5326. No fracture or spondylolisthesis.

## 2019-06-30 IMAGING — CR DG CHEST 2V
2 series · 2 of 2 positions shown · non-contrast
Comparison: December 07, 2015

CLINICAL DATA: Asbestos exposure

EXAM:
CHEST - 2 VIEW

[w chest pa]
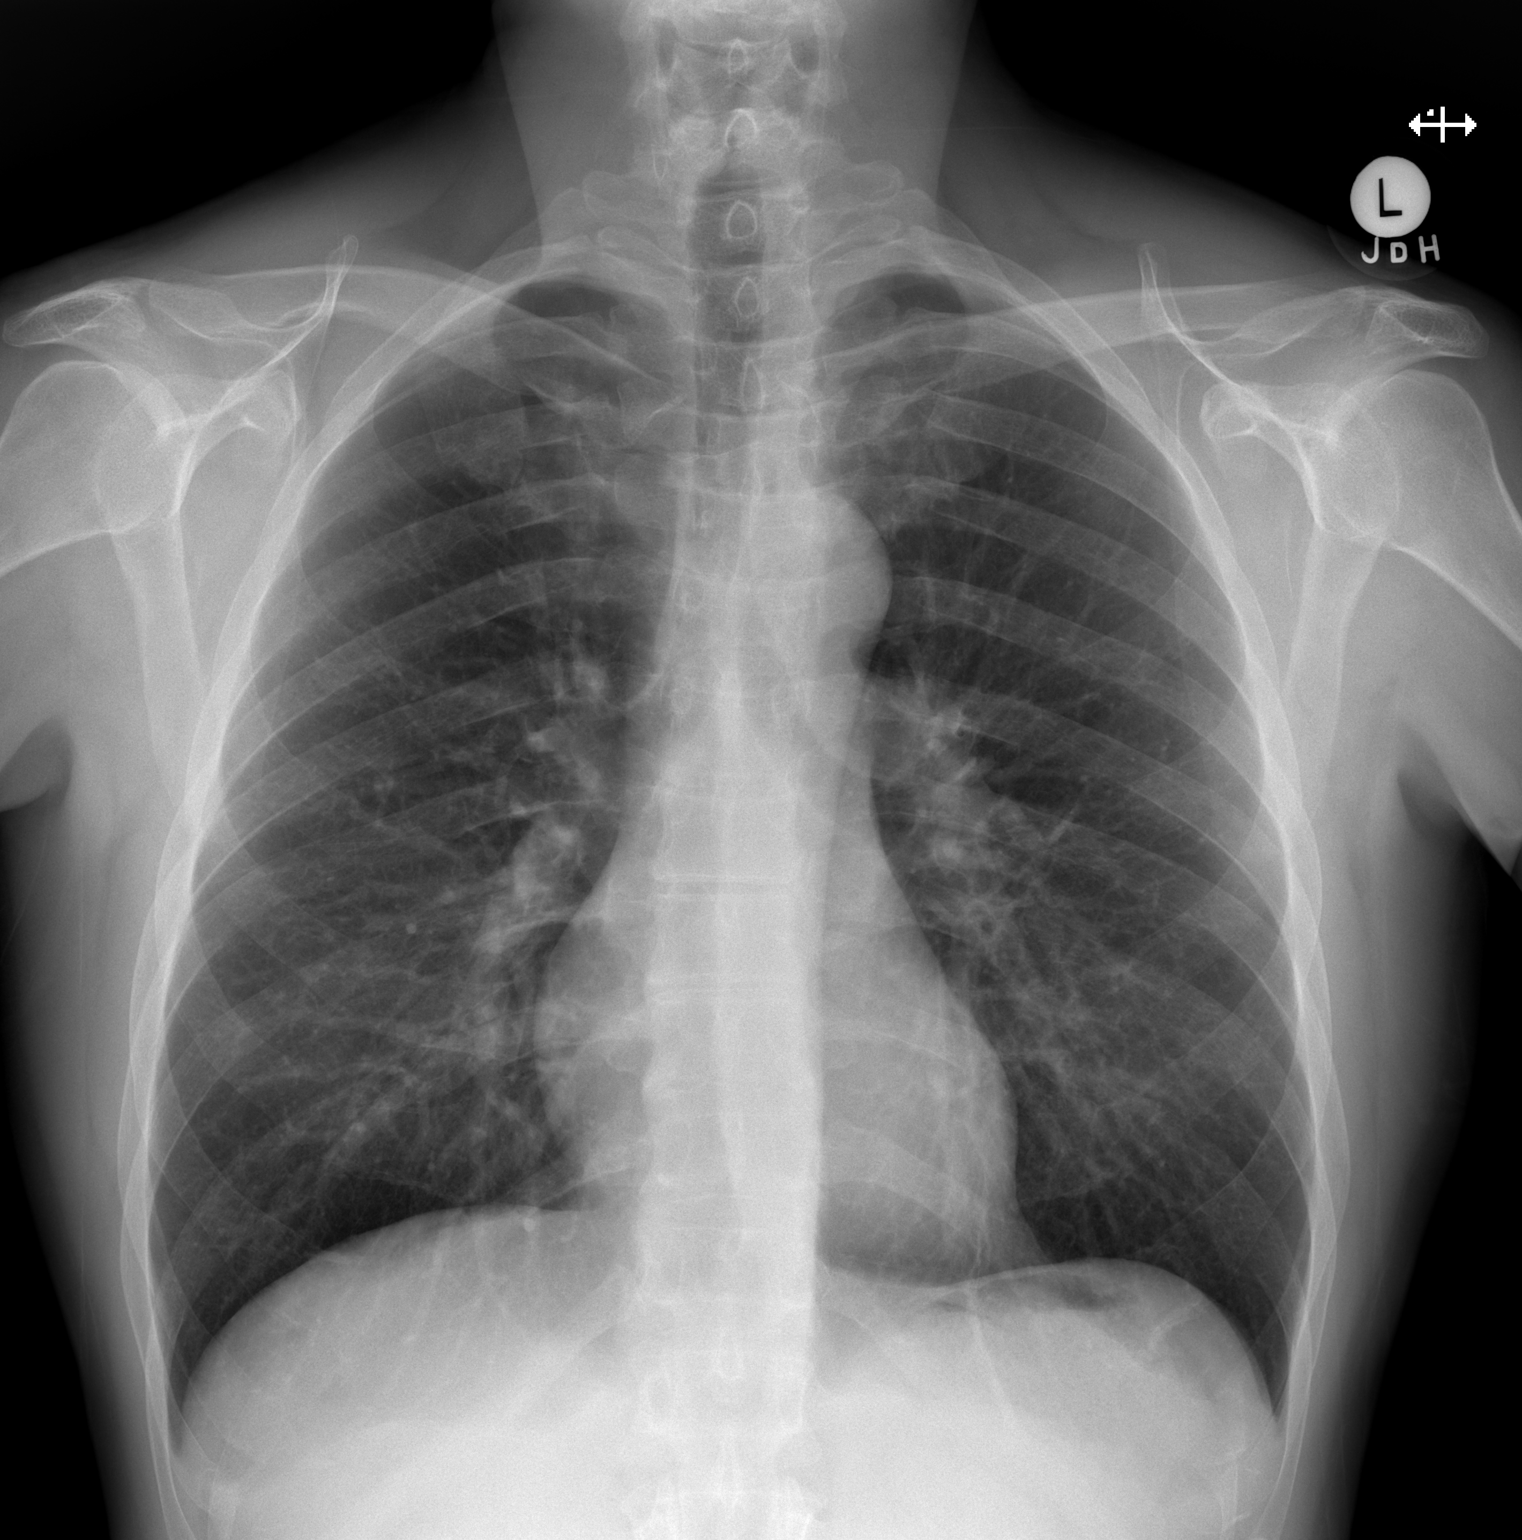

[w chest lat]
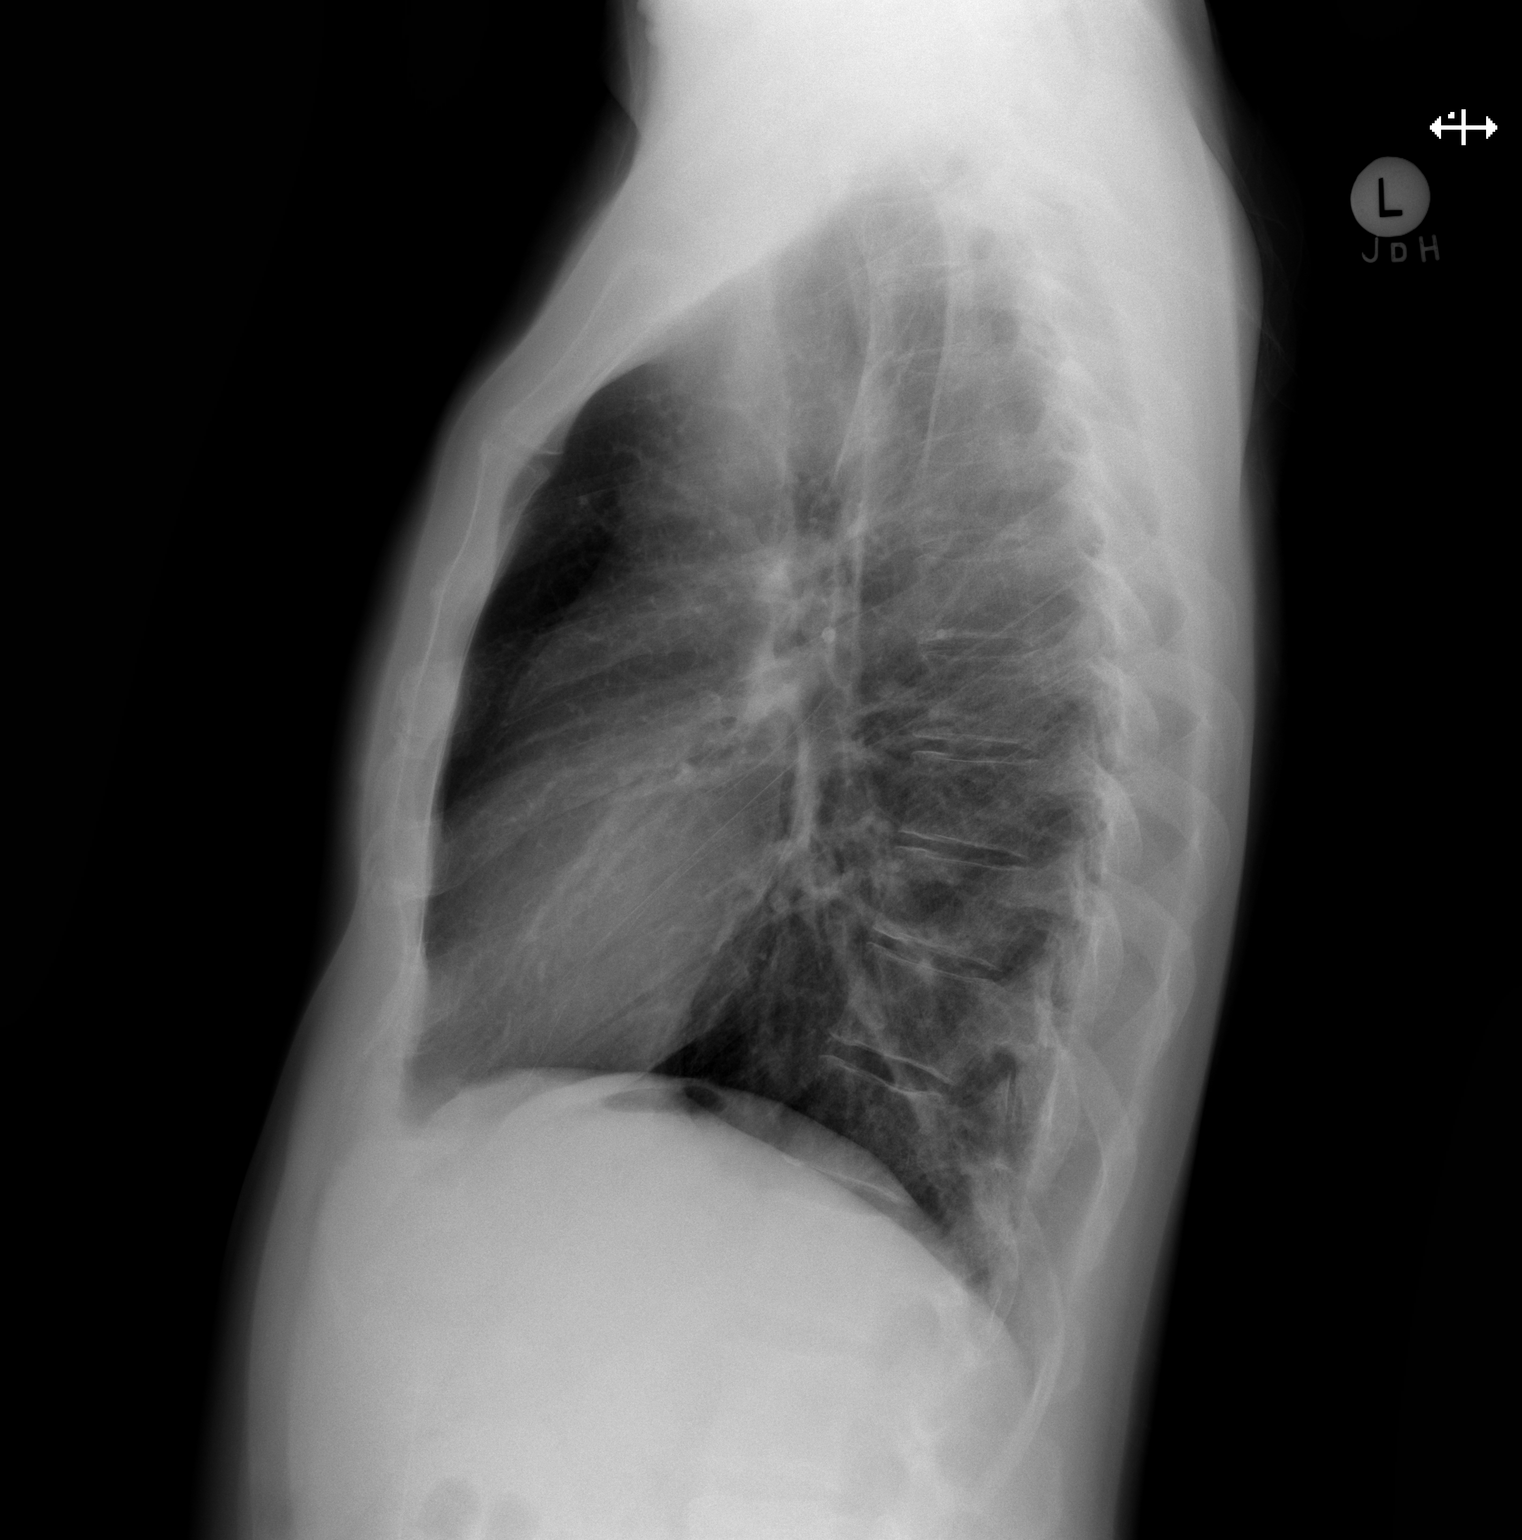

[2 of 2 positions shown; findings below may reference images not displayed]

FINDINGS: There is no edema or consolidation. Heart size and pulmonary
vascular normal. No adenopathy. No pleural plaques or calcifications
evident. No bone lesions.
IMPRESSION: No appreciable abnormality.

## 2022-04-28 ENCOUNTER — Other Ambulatory Visit: Payer: Self-pay | Admitting: Family Medicine

## 2022-04-28 ENCOUNTER — Other Ambulatory Visit: Payer: Self-pay

## 2022-04-28 DIAGNOSIS — F17200 Nicotine dependence, unspecified, uncomplicated: Secondary | ICD-10-CM

## 2022-06-09 ENCOUNTER — Ambulatory Visit
Admission: RE | Admit: 2022-06-09 | Discharge: 2022-06-09 | Disposition: A | Discharge status: Home / Self Care | Payer: BC Managed Care – PPO | Source: Ambulatory Visit | Attending: Family Medicine | Admitting: Family Medicine

## 2022-06-09 DIAGNOSIS — F17200 Nicotine dependence, unspecified, uncomplicated: Secondary | ICD-10-CM

## 2023-05-11 ENCOUNTER — Other Ambulatory Visit: Payer: Self-pay | Admitting: Family Medicine

## 2023-05-11 DIAGNOSIS — F17200 Nicotine dependence, unspecified, uncomplicated: Secondary | ICD-10-CM

## 2023-06-11 ENCOUNTER — Ambulatory Visit
Admission: RE | Admit: 2023-06-11 | Discharge: 2023-06-11 | Disposition: A | Discharge status: Home / Self Care | Payer: Self-pay | Source: Ambulatory Visit | Attending: Family Medicine | Admitting: Family Medicine

## 2023-06-11 DIAGNOSIS — F17200 Nicotine dependence, unspecified, uncomplicated: Secondary | ICD-10-CM

## 2023-11-28 ENCOUNTER — Other Ambulatory Visit: Payer: Self-pay

## 2023-11-28 ENCOUNTER — Emergency Department (HOSPITAL_BASED_OUTPATIENT_CLINIC_OR_DEPARTMENT_OTHER)
Admission: EM | Admit: 2023-11-28 | Discharge: 2023-11-28 | Disposition: A | Discharge status: Home / Self Care | Attending: Emergency Medicine | Admitting: Emergency Medicine

## 2023-11-28 ENCOUNTER — Encounter (HOSPITAL_BASED_OUTPATIENT_CLINIC_OR_DEPARTMENT_OTHER): Payer: Self-pay

## 2023-11-28 ENCOUNTER — Emergency Department (HOSPITAL_BASED_OUTPATIENT_CLINIC_OR_DEPARTMENT_OTHER)

## 2023-11-28 DIAGNOSIS — R109 Unspecified abdominal pain: Secondary | ICD-10-CM | POA: Diagnosis present

## 2023-11-28 DIAGNOSIS — A09 Infectious gastroenteritis and colitis, unspecified: Secondary | ICD-10-CM | POA: Insufficient documentation

## 2023-11-28 LAB — COMPREHENSIVE METABOLIC PANEL WITH GFR
ALT: 24 U/L (ref 0–44)
AST: 21 U/L (ref 15–41)
Albumin: 4.3 g/dL (ref 3.5–5.0)
Alkaline Phosphatase: 65 U/L (ref 38–126)
Anion gap: 9 (ref 5–15)
BUN: 13 mg/dL (ref 6–20)
CO2: 26 mmol/L (ref 22–32)
Calcium: 9.6 mg/dL (ref 8.9–10.3)
Chloride: 104 mmol/L (ref 98–111)
Creatinine, Ser: 0.69 mg/dL (ref 0.61–1.24)
GFR, Estimated: >60 mL/min (ref >60)
Glucose, Bld: 121 mg/dL — ABNORMAL HIGH (ref 70–99)
Potassium: 4.1 mmol/L (ref 3.5–5.1)
Sodium: 139 mmol/L (ref 135–145)
Total Bilirubin: 0.3 mg/dL (ref 0.0–1.2)
Total Protein: 6.9 g/dL (ref 6.5–8.1)

## 2023-11-28 LAB — CBC
HCT: 39.5 % (ref 39.0–52.0)
Hemoglobin: 13.7 g/dL (ref 13.0–17.0)
MCH: 31.1 pg (ref 26.0–34.0)
MCHC: 34.7 g/dL (ref 30.0–36.0)
MCV: 89.8 fL (ref 80.0–100.0)
Platelets: 165 K/uL (ref 150–400)
RBC: 4.4 MIL/uL (ref 4.22–5.81)
RDW: 11.3 % — ABNORMAL LOW (ref 11.5–15.5)
WBC: 4.1 K/uL (ref 4.0–10.5)
nRBC: 0 % (ref 0.0–0.2)

## 2023-11-28 LAB — URINALYSIS, ROUTINE W REFLEX MICROSCOPIC
Bacteria, UA: NONE SEEN
Bilirubin Urine: NEGATIVE
Glucose, UA: NEGATIVE mg/dL
Hgb urine dipstick: NEGATIVE
Ketones, ur: NEGATIVE mg/dL
Leukocytes,Ua: NEGATIVE
Nitrite: NEGATIVE
Protein, ur: NEGATIVE mg/dL
Specific Gravity, Urine: 1.019 (ref 1.005–1.030)
pH: 6 (ref 5.0–8.0)

## 2023-11-28 LAB — LIPASE, BLOOD: Lipase: 19 U/L (ref 11–51)

## 2023-11-28 MED ORDER — IOHEXOL 300 MG/ML  SOLN
100.0000 mL | Freq: Once | INTRAMUSCULAR | Status: AC | PRN
  Administered 2023-11-28: 100 mL via INTRAVENOUS

## 2023-11-28 MED ORDER — AZITHROMYCIN 250 MG PO TABS
1000.0000 mg | ORAL_TABLET | Freq: Once | ORAL | Status: AC
  Administered 2023-11-28: 1000 mg via ORAL
  Filled 2023-11-28: qty 4

## 2023-11-28 NOTE — ED Triage Notes (Signed)
 Pt reports loose stool x3 days and noticed bright red blood when wiping.

## 2023-11-28 NOTE — ED Provider Notes (Signed)
 [place] EMERGENCY DEPARTMENT AT [facility] Provider Note   CSN: [id number] Arrival date & time: 11/28/23  1515     Patient presents with: Diarrhea   Alejandro Harris is a 53 y.o. male.  With a history of kidney stones who presents to the ED for reported GI bleeding.  Patient has had loose stools for last 3 days.  Today he noticed bright red blood after wiping prompting him to seek evaluation at urgent care.  He was then directed here for further evaluation.  No nausea vomiting fevers or chills.  Some vague abdominal discomfort that does not localize.  No rectal discomfort no prior history of GI bleeding.  No anticoagulation.  No alcohol or other drugs.    Diarrhea      Prior to Admission medications   Medication Sig Start Date End Date Taking? Authorizing Provider  cetirizine (ZYRTEC) 10 MG tablet Take 10 mg by mouth daily.    [provider]  ibuprofen (ADVIL) 200 MG tablet Take 400 mg by mouth every 6 (six) hours as needed for headache or mild pain.    [provider]  naproxen  (NAPROSYN ) 500 MG tablet Take 1 tablet (500 mg total) by mouth 2 (two) times daily. Patient not taking: Reported on 05/30/2018 01/26/13   Lennie Arthea DEL, PA-C  naproxen  sodium (ALEVE ) 220 MG tablet Take 220 mg by mouth 2 (two) times daily as needed (pain/headache).    [provider]  ondansetron  (ZOFRAN  ODT) 4 MG disintegrating tablet Take 1 tablet (4 mg total) by mouth every 8 (eight) hours as needed for nausea or vomiting. 05/30/18   Joy, Shawn C, PA-C  oxyCODONE -acetaminophen  (PERCOCET/ROXICET) 5-325 MG tablet Take 1-2 tablets by mouth every 6 (six) hours as needed for severe pain. 05/30/18   Joy, Shawn C, PA-C  polyvinyl alcohol (LIQUIFILM TEARS) 1.4 % ophthalmic solution Place 1 drop into both eyes as needed for dry eyes.    [provider]  predniSONE  (DELTASONE ) 10 MG tablet 4 tabs PO QD for 4 days;  3 tabs PO QD for 3 days;  2 tabs PO QD for 2 days;   1 tab PO QD for 1 day Patient not taking: Reported on 05/30/2018 01/26/13   Lennie Arthea DEL, PA-C  tamsulosin  (FLOMAX ) 0.4 MG CAPS capsule Take 1 capsule (0.4 mg total) by mouth daily. 05/30/18   Joy, Shawn C, PA-C    Allergies: Patient has no known allergies.    Review of Systems  Gastrointestinal:  Positive for diarrhea.    Updated Vital Signs BP 114/78   Pulse 81   Temp 98.7 F (37.1 C)   Resp 16   Ht 5' 9 (1.753 m)   Wt 77.1 kg   SpO2 98%   BMI 25.10 kg/m   Physical Exam Vitals and nursing note reviewed.  HENT:     Head: Normocephalic and atraumatic.  Eyes:     Pupils: Pupils are equal, round, and reactive to light.  Cardiovascular:     Rate and Rhythm: Normal rate and regular rhythm.  Pulmonary:     Effort: Pulmonary effort is normal.     Breath sounds: Normal breath sounds.  Abdominal:     Palpations: Abdomen is soft.     Tenderness: There is no abdominal tenderness.  Skin:    General: Skin is warm and dry.  Neurological:     Mental Status: He is alert.  Psychiatric:        Mood and Affect: Mood normal.     (  all labs ordered are listed, but only abnormal results are displayed) Labs Reviewed  COMPREHENSIVE METABOLIC PANEL WITH GFR - Abnormal; Notable for the following components:      Result Value   Glucose, Bld 121 (*)    All other components within normal limits  CBC - Abnormal; Notable for the following components:   RDW 11.3 (*)    All other components within normal limits  LIPASE, BLOOD  URINALYSIS, ROUTINE W REFLEX MICROSCOPIC    EKG: None  Radiology: CT ABDOMEN PELVIS W CONTRAST Result Date: 11/28/2023 CLINICAL DATA:  LLQ abdominal pain EXAM: CT ABDOMEN AND PELVIS WITH CONTRAST TECHNIQUE: Multidetector CT imaging of the abdomen and pelvis was performed using the standard protocol following bolus administration of intravenous contrast. RADIATION DOSE REDUCTION: This exam was performed according to the departmental dose-optimization program  which includes automated exposure control, adjustment of the mA and/or kV according to patient size and/or use of iterative reconstruction technique. CONTRAST:  OMNIPAQUE  IOHEXOL  300 MG/ML  SOLN COMPARISON:  June 27, 2008 FINDINGS: Lower chest: No focal airspace consolidation or pleural effusion.Posterior bibasilar dependent atelectasis. Hepatobiliary: No mass.Decompressed gallbladder without radiopaque stones or wall thickening. No intrahepatic or extrahepatic biliary ductal dilation. The portal veins are patent. Pancreas: No mass or main ductal dilation. No peripancreatic inflammation or fluid collection. Spleen: Normal size. No mass. Adrenals/Urinary Tract: No adrenal masses. 1.5 cm left upper pole renal cyst. No nephrolithiasis or hydronephrosis. The urinary bladder is distended without focal abnormality. Stomach/Bowel: The stomach is decompressed without focal abnormality. No small bowel wall thickening or inflammation. No small bowel obstruction.Normal appendix. Severe wall thickening and submucosal edema throughout the transverse colon. Vascular/Lymphatic: No aortic aneurysm. Scattered aortoiliac atherosclerosis. No intraabdominal or pelvic lymphadenopathy. Reproductive: The prostate gland is at the upper limits of normal for size.No free pelvic fluid. Other: No pneumoperitoneum or ascites. Musculoskeletal: No acute fracture or destructive lesion. Small volume symmetric bilateral gynecomastia. Mild degenerative disc disease at L5-S1. IMPRESSION: Severe wall thickening and submucosal edema throughout the transverse colon, consistent with an infectious or inflammatory colitis. Electronically Signed   By: Rogelia Myers M.D.   On: 11/28/2023 17:52     Procedures   Medications Ordered in the ED  azithromycin  (ZITHROMAX ) tablet 1,000 mg (has no administration in time range)  iohexol  (OMNIPAQUE ) 300 MG/ML solution 100 mL (100 mLs Intravenous Contrast Given 11/28/23 1617)    Clinical Course as of  11/28/23 1803  Wed Nov 28, 2023  1800 Laboratory workup unremarkable.  CT abdomen pelvis shows inflammatory versus infectious colitis.  Most consistent with infectious colitis.  Will provide single dose of azithromycin  1000 mg p.o. here to cover invasive bacterial pathogen such as Shigella Salmonella Campylobacter.  Low suspicion for C. difficile without recent antibiotic use, history of C. difficile or other risk factors.  Will instruct for close PCP follow-up and provided the number for outpatient GI follow-up if symptoms do not improve [MP]    Clinical Course User Index [MP] Pamella Ozell LABOR, DO                                 Medical Decision Making 53 year old male with history as above presenting to the ED for concern of GI bleeding.  Days of loose stools with bright red blood streaked within bowel movement today.  Well-appearing on my exam with no focal abdominal tenderness.  Afebrile.  Will obtain laboratory workup CT abdomen pelvis to look  for acute infectious process such as diverticulitis that may cause his symptoms.  Other differentials to consider would be internal hemorrhoid or viral GI illness.  Amount and/or Complexity of Data Reviewed Labs: ordered. Radiology: ordered.  Risk Prescription drug management.        Final diagnoses:  Infectious colitis    ED Discharge Orders     None          Pamella Ozell LABOR, DO 11/28/23 1803

## 2023-11-28 NOTE — Discharge Instructions (Signed)
 You were seen in the Emergency Department for loose stools and bloody bowel movements The CAT scan shows inflammation of the colon which is likely due to an infection We covered you for bacterial infection with 1 dose of antibiotics here This may also be caused by a virus Either way, your symptoms should be improving within the next 2 to 3 days Keep well-hydrated at home If your symptoms do not resolve please follow-up with [facility] in the office to see a gastroenterologist Otherwise follow-up with your primary doctor within 1 week for reevaluation Return to the emergency room for severe pain or any other concerns
